# Patient Record
Sex: Female | Born: 1985 | Race: White | Hispanic: No | Marital: Single | Smoking: Current every day smoker
Health system: Southern US, Community
[De-identification: ages and names within clinical notes are randomized; demographics above are authoritative.]

## PROBLEM LIST (undated history)

## (undated) DIAGNOSIS — G8929 Other chronic pain: Secondary | ICD-10-CM

## (undated) DIAGNOSIS — R569 Unspecified convulsions: Secondary | ICD-10-CM

## (undated) DIAGNOSIS — M549 Dorsalgia, unspecified: Secondary | ICD-10-CM

## (undated) DIAGNOSIS — M419 Scoliosis, unspecified: Secondary | ICD-10-CM

## (undated) DIAGNOSIS — M797 Fibromyalgia: Secondary | ICD-10-CM

## (undated) HISTORY — PX: ABDOMINAL HYSTERECTOMY: SHX81

## (undated) HISTORY — PX: BLADDER SURGERY: SHX569

---
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
## 2012-02-05 ENCOUNTER — Emergency Department (HOSPITAL_COMMUNITY)
Admission: EM | Admit: 2012-02-05 | Discharge: 2012-02-05 | Disposition: A | Discharge status: Home / Self Care | Payer: Medicaid - Out of State | Attending: Emergency Medicine | Admitting: Emergency Medicine

## 2012-02-05 ENCOUNTER — Emergency Department (HOSPITAL_COMMUNITY): Payer: Medicaid - Out of State

## 2012-02-05 ENCOUNTER — Encounter (HOSPITAL_COMMUNITY): Payer: Self-pay | Admitting: Emergency Medicine

## 2012-02-05 DIAGNOSIS — B349 Viral infection, unspecified: Secondary | ICD-10-CM

## 2012-02-05 DIAGNOSIS — Z8701 Personal history of pneumonia (recurrent): Secondary | ICD-10-CM | POA: Insufficient documentation

## 2012-02-05 DIAGNOSIS — M4120 Other idiopathic scoliosis, site unspecified: Secondary | ICD-10-CM | POA: Insufficient documentation

## 2012-02-05 DIAGNOSIS — R110 Nausea: Secondary | ICD-10-CM | POA: Insufficient documentation

## 2012-02-05 DIAGNOSIS — J069 Acute upper respiratory infection, unspecified: Secondary | ICD-10-CM

## 2012-02-05 DIAGNOSIS — B9789 Other viral agents as the cause of diseases classified elsewhere: Secondary | ICD-10-CM | POA: Insufficient documentation

## 2012-02-05 DIAGNOSIS — R5381 Other malaise: Secondary | ICD-10-CM | POA: Insufficient documentation

## 2012-02-05 DIAGNOSIS — J3489 Other specified disorders of nose and nasal sinuses: Secondary | ICD-10-CM | POA: Insufficient documentation

## 2012-02-05 DIAGNOSIS — R6883 Chills (without fever): Secondary | ICD-10-CM | POA: Insufficient documentation

## 2012-02-05 DIAGNOSIS — F17200 Nicotine dependence, unspecified, uncomplicated: Secondary | ICD-10-CM | POA: Insufficient documentation

## 2012-02-05 DIAGNOSIS — Z79899 Other long term (current) drug therapy: Secondary | ICD-10-CM | POA: Insufficient documentation

## 2012-02-05 DIAGNOSIS — R5383 Other fatigue: Secondary | ICD-10-CM | POA: Insufficient documentation

## 2012-02-05 HISTORY — DX: Scoliosis, unspecified: M41.9

## 2012-02-05 HISTORY — DX: Other chronic pain: G89.29

## 2012-02-05 HISTORY — DX: Dorsalgia, unspecified: M54.9

## 2012-02-05 MED ORDER — BENZONATATE 100 MG PO CAPS: 100.0000 mg | ORAL_CAPSULE | Freq: Three times a day (TID) | ORAL | Status: DC | PRN

## 2012-02-05 NOTE — ED Notes (Signed)
Pt not in room, gown on bed.   

## 2012-02-05 NOTE — ED Provider Notes (Signed)
History     CSN: [id number]  Arrival date & time 02/05/12  1219   First MD Initiated Contact with Patient 02/05/12 1305      Chief Complaint  Patient presents with  . Cough  . Nasal Congestion  . Generalized Body Aches     HPI Pt was seen at 1325.   Per pt, c/o gradual onset and persistence of constant generalized body aches/fatigue, chills, runny/stuffy nose, ears and sinus congestion, nausea and cough for the past 1 week.  States she was eval at [facility] ER for same, dx with "double pneumonia" on a CXR, and rx antibiotics.  States she has taken them without relief.  Denies fevers, no rash, no CP/SOB, no N/V/D, no abd pain.     Past Medical History  Diagnosis Date  . Scoliosis   . Chronic back pain     Past Surgical History  Procedure Date  . Bladder surgery      History  Substance Use Topics  . Smoking status: Current Every Day Smoker -- 1.0 packs/day    Types: Cigarettes  . Smokeless tobacco: Not on file  . Alcohol Use: No      Review of Systems ROS: Statement: All systems negative except as marked or noted in the HPI; Constitutional: Negative for fever and +chills, generalized body aches/fatigue. ; ; Eyes: Negative for eye pain, redness and discharge. ; ; ENMT: Negative for ear pain, hoarseness, sore throat. +ears and nasal congestion, rhinorrhea, sinus pressure. ; ; Cardiovascular: Negative for chest pain, palpitations, diaphoresis, dyspnea and peripheral edema. ; ; Respiratory: +cough. Negative for wheezing and stridor. ; ; Gastrointestinal: +nausea. Negative for vomiting, diarrhea, abdominal pain, blood in stool, hematemesis, jaundice and rectal bleeding. . ; ; Genitourinary: Negative for dysuria, flank pain and hematuria. ; ; Musculoskeletal: Negative for back pain and neck pain. Negative for swelling and trauma.; ; Skin: Negative for pruritus, rash, abrasions, blisters, bruising and skin lesion.; ; Neuro: Negative for headache, lightheadedness and neck  stiffness. Negative for weakness, altered level of consciousness , altered mental status, extremity weakness, paresthesias, involuntary movement, seizure and syncope.      Allergies  Hydrocodone  Home Medications   Current Outpatient Rx  Name  Route  Sig  Dispense  Refill  . ALBUTEROL SULFATE HFA 108 (90 BASE) MCG/ACT IN AERS   Inhalation   Inhale 2 puffs into the lungs every 6 (six) hours as needed.         . IBUPROFEN 800 MG PO TABS   Oral   Take 800 mg by mouth every 8 (eight) hours as needed. For pain         . BENZONATATE 100 MG PO CAPS   Oral   Take 1 capsule (100 mg total) by mouth 3 (three) times daily as needed for cough.   15 capsule   0     BP 124/67  Pulse 91  Temp 97.5 F (36.4 C) (Oral)  Resp 18  Ht 5\' 9"  (1.753 m)  Wt 230 lb (104.327 kg)  BMI 33.96 kg/m2  SpO2 99%  LMP 01/22/2012  Physical Exam 1330: Physical examination:  Nursing notes reviewed; Vital signs and O2 SAT reviewed;  Constitutional: Well developed, Well nourished, Well hydrated, In no acute distress; Head:  Normocephalic, atraumatic; Eyes: EOMI, PERRL, No scleral icterus; ENMT: TM's clear bilat. +edemetous nasal turbinates bilat with clear rhinorrhea. Mouth and pharynx without lesions. No tonsillar exudates. No intra-oral edema. No hoarse voice, no drooling, no stridor. Mouth and  pharynx normal, Mucous membranes moist; Neck: Supple, Full range of motion, No lymphadenopathy; Cardiovascular: Regular rate and rhythm, No murmur, rub, or gallop; Respiratory: Breath sounds clear & equal bilaterally, No rales, rhonchi, wheezes.  Speaking full sentences with ease, Normal respiratory effort/excursion; Chest: Nontender, Movement normal; Abdomen: Soft, Nontender, Nondistended, Normal bowel sounds; Genitourinary: No CVA tenderness; Extremities: Pulses normal, No tenderness, No edema, No calf edema or asymmetry.; Neuro: AA&Ox3, Major CN grossly intact.  Speech clear. Gait steady. No gross focal motor or  sensory deficits in extremities.; Skin: Color normal, Warm, Dry.   ED Course  Procedures    MDM  MDM Reviewed: previous chart, nursing note and vitals Interpretation: x-ray   Dg Chest 2 View 02/05/2012  *RADIOLOGY REPORT*  Clinical Data: Cough  CHEST - 2 VIEW  Comparison: 02/05/2012  Findings: Normal heart size.  Clear lungs.  No pleural effusion. No pneumothorax.  Lateral left lower ribs not included limiting the exam.  IMPRESSION: No active cardiopulmonary disease.   Original Report Authenticated By: Jolaine Click, M.D.        1345:  No pneumonia on CXR.  Appears viral illness at this time, tx symptomatically.  Pt asking for "something stronger" for her generalized body aches "other than tylenol and motrin."  Informed that narcotic pain medications are not indicated for what appears to be a viral illness at this time.  Verb understanding.  States she already has an albuterol MDI at home but is not using it.  Encouraged to use 2-4 puffs every 4 hours for the next 7 days.  Will rx tessalon for cough.  Dx and testing d/w pt.  Questions answered.  Verb understanding, agreeable to d/c home with outpt f/u.         Laray Anger, DO 02/07/12 1337

## 2012-02-05 NOTE — ED Notes (Signed)
Pt c/o cough/congestion/body aches. Pt dx with pne x 1 week ago and on antibx with no relief.

## 2012-02-05 NOTE — ED Notes (Signed)
Pt reports was diagnosed with double pneumonia last week at [facility].  Says has been taking her antibiotics but isn't any better.  C/O congestion,  Nausea, "slimy stools"  And r earache.  Also reports chills.

## 2012-04-04 ENCOUNTER — Emergency Department (HOSPITAL_COMMUNITY): Payer: Medicaid - Out of State

## 2012-04-04 ENCOUNTER — Encounter (HOSPITAL_COMMUNITY): Payer: Self-pay

## 2012-04-04 ENCOUNTER — Emergency Department (HOSPITAL_COMMUNITY)
Admission: EM | Admit: 2012-04-04 | Discharge: 2012-04-04 | Disposition: A | Discharge status: Home / Self Care | Payer: Medicaid - Out of State | Attending: Emergency Medicine | Admitting: Emergency Medicine

## 2012-04-04 DIAGNOSIS — IMO0002 Reserved for concepts with insufficient information to code with codable children: Secondary | ICD-10-CM | POA: Insufficient documentation

## 2012-04-04 DIAGNOSIS — S0993XA Unspecified injury of face, initial encounter: Secondary | ICD-10-CM | POA: Insufficient documentation

## 2012-04-04 DIAGNOSIS — T07XXXA Unspecified multiple injuries, initial encounter: Secondary | ICD-10-CM

## 2012-04-04 DIAGNOSIS — S139XXA Sprain of joints and ligaments of unspecified parts of neck, initial encounter: Secondary | ICD-10-CM | POA: Insufficient documentation

## 2012-04-04 DIAGNOSIS — Z791 Long term (current) use of non-steroidal anti-inflammatories (NSAID): Secondary | ICD-10-CM | POA: Insufficient documentation

## 2012-04-04 DIAGNOSIS — Z8669 Personal history of other diseases of the nervous system and sense organs: Secondary | ICD-10-CM | POA: Insufficient documentation

## 2012-04-04 DIAGNOSIS — S161XXA Strain of muscle, fascia and tendon at neck level, initial encounter: Secondary | ICD-10-CM

## 2012-04-04 DIAGNOSIS — S298XXA Other specified injuries of thorax, initial encounter: Secondary | ICD-10-CM | POA: Insufficient documentation

## 2012-04-04 DIAGNOSIS — M549 Dorsalgia, unspecified: Secondary | ICD-10-CM

## 2012-04-04 DIAGNOSIS — Z79899 Other long term (current) drug therapy: Secondary | ICD-10-CM | POA: Insufficient documentation

## 2012-04-04 DIAGNOSIS — Z8739 Personal history of other diseases of the musculoskeletal system and connective tissue: Secondary | ICD-10-CM | POA: Insufficient documentation

## 2012-04-04 DIAGNOSIS — F17200 Nicotine dependence, unspecified, uncomplicated: Secondary | ICD-10-CM | POA: Insufficient documentation

## 2012-04-04 DIAGNOSIS — S199XXA Unspecified injury of neck, initial encounter: Secondary | ICD-10-CM | POA: Insufficient documentation

## 2012-04-04 DIAGNOSIS — S4980XA Other specified injuries of shoulder and upper arm, unspecified arm, initial encounter: Secondary | ICD-10-CM | POA: Insufficient documentation

## 2012-04-04 DIAGNOSIS — S46909A Unspecified injury of unspecified muscle, fascia and tendon at shoulder and upper arm level, unspecified arm, initial encounter: Secondary | ICD-10-CM | POA: Insufficient documentation

## 2012-04-04 HISTORY — DX: Unspecified convulsions: R56.9

## 2012-04-04 LAB — CBC WITH DIFFERENTIAL/PLATELET
HCT: 39.3 % (ref 36.0–46.0)
Hemoglobin: 13.3 g/dL (ref 12.0–15.0)
Lymphocytes Relative: 34 % (ref 12–46)
Monocytes Absolute: 0.4 10*3/uL (ref 0.1–1.0)
Monocytes Relative: 7 % (ref 3–12)
Neutro Abs: 3.6 10*3/uL (ref 1.7–7.7)
RBC: 4.29 MIL/uL (ref 3.87–5.11)
WBC: 6.4 10*3/uL (ref 4.0–10.5)

## 2012-04-04 LAB — BASIC METABOLIC PANEL
BUN: 7 mg/dL (ref 6–23)
CO2: 25 mEq/L (ref 19–32)
Chloride: 104 mEq/L (ref 96–112)
Creatinine, Ser: 0.67 mg/dL (ref 0.50–1.10)
Potassium: 3.9 mEq/L (ref 3.5–5.1)

## 2012-04-04 MED ORDER — HYDROMORPHONE HCL PF 1 MG/ML IJ SOLN
1.0000 mg | Freq: Once | INTRAMUSCULAR | Status: AC
  Administered 2012-04-04: 1 mg via INTRAVENOUS
  Filled 2012-04-04: qty 1

## 2012-04-04 MED ORDER — IOHEXOL 300 MG/ML  SOLN
100.0000 mL | Freq: Once | INTRAMUSCULAR | Status: AC | PRN
  Administered 2012-04-04: 100 mL via INTRAVENOUS

## 2012-04-04 MED ORDER — TRAMADOL HCL 50 MG PO TABS: 50.0000 mg | ORAL_TABLET | Freq: Four times a day (QID) | ORAL | Status: DC | PRN

## 2012-04-04 MED ORDER — ONDANSETRON HCL 4 MG/2ML IJ SOLN
4.0000 mg | Freq: Once | INTRAMUSCULAR | Status: AC
  Administered 2012-04-04: 4 mg via INTRAVENOUS
  Filled 2012-04-04: qty 2

## 2012-04-04 MED ORDER — NAPROXEN 500 MG PO TABS: 500.0000 mg | ORAL_TABLET | Freq: Two times a day (BID) | ORAL | Status: DC

## 2012-04-04 NOTE — ED Notes (Signed)
Pt requested a prescription for xanax.  Says she uses xanax to treat her seizures.  Notified Dr. Deretha Emory, no prescription for xanax given.  PT also said that tramadol causes her to have seizures so could not take.  Notified EDP.
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
## 2012-10-03 DIAGNOSIS — R569 Unspecified convulsions: Secondary | ICD-10-CM

## 2012-10-28 ENCOUNTER — Encounter (HOSPITAL_COMMUNITY): Payer: Self-pay | Admitting: Emergency Medicine

## 2012-10-28 ENCOUNTER — Emergency Department (HOSPITAL_COMMUNITY)
Admission: EM | Admit: 2012-10-28 | Discharge: 2012-10-29 | Discharge status: Against Medical Advice | Payer: Medicaid - Out of State | Attending: Emergency Medicine | Admitting: Emergency Medicine

## 2012-10-28 DIAGNOSIS — R51 Headache: Secondary | ICD-10-CM | POA: Insufficient documentation

## 2012-10-28 NOTE — ED Notes (Signed)
Pt c/o body aches and headace x 2 days.

## 2012-10-29 NOTE — ED Notes (Signed)
Unable to locate pt in all waiting areas x 3 

## 2012-12-14 ENCOUNTER — Emergency Department (HOSPITAL_COMMUNITY): Payer: Medicaid - Out of State

## 2012-12-14 ENCOUNTER — Emergency Department (HOSPITAL_COMMUNITY)
Admission: EM | Admit: 2012-12-14 | Discharge: 2012-12-14 | Disposition: A | Discharge status: Home / Self Care | Payer: Medicaid - Out of State | Attending: Emergency Medicine | Admitting: Emergency Medicine

## 2012-12-14 ENCOUNTER — Encounter (HOSPITAL_COMMUNITY): Payer: Self-pay | Admitting: Emergency Medicine

## 2012-12-14 DIAGNOSIS — Z3202 Encounter for pregnancy test, result negative: Secondary | ICD-10-CM | POA: Insufficient documentation

## 2012-12-14 DIAGNOSIS — Z79899 Other long term (current) drug therapy: Secondary | ICD-10-CM | POA: Insufficient documentation

## 2012-12-14 DIAGNOSIS — R109 Unspecified abdominal pain: Secondary | ICD-10-CM | POA: Insufficient documentation

## 2012-12-14 DIAGNOSIS — G40909 Epilepsy, unspecified, not intractable, without status epilepticus: Secondary | ICD-10-CM | POA: Insufficient documentation

## 2012-12-14 DIAGNOSIS — M545 Low back pain, unspecified: Secondary | ICD-10-CM | POA: Insufficient documentation

## 2012-12-14 DIAGNOSIS — Z91040 Latex allergy status: Secondary | ICD-10-CM | POA: Insufficient documentation

## 2012-12-14 DIAGNOSIS — G8911 Acute pain due to trauma: Secondary | ICD-10-CM | POA: Insufficient documentation

## 2012-12-14 DIAGNOSIS — F17200 Nicotine dependence, unspecified, uncomplicated: Secondary | ICD-10-CM | POA: Insufficient documentation

## 2012-12-14 DIAGNOSIS — G8929 Other chronic pain: Secondary | ICD-10-CM | POA: Insufficient documentation

## 2012-12-14 DIAGNOSIS — M25539 Pain in unspecified wrist: Secondary | ICD-10-CM | POA: Insufficient documentation

## 2012-12-14 LAB — URINALYSIS, ROUTINE W REFLEX MICROSCOPIC
Bilirubin Urine: NEGATIVE
Glucose, UA: NEGATIVE mg/dL
Ketones, ur: NEGATIVE mg/dL
Leukocytes, UA: NEGATIVE
Specific Gravity, Urine: 1.02 (ref 1.005–1.030)
pH: 7 (ref 5.0–8.0)

## 2012-12-14 MED ORDER — OXYCODONE-ACETAMINOPHEN 5-325 MG PO TABS
1.0000 | ORAL_TABLET | Freq: Once | ORAL | Status: AC
  Administered 2012-12-14: 1 via ORAL
  Filled 2012-12-14: qty 1

## 2012-12-14 MED ORDER — IBUPROFEN 600 MG PO TABS: 600.0000 mg | ORAL_TABLET | Freq: Four times a day (QID) | ORAL | Status: DC | PRN

## 2012-12-14 MED ORDER — OXYCODONE-ACETAMINOPHEN 5-325 MG PO TABS: 1.0000 | ORAL_TABLET | ORAL | Status: DC | PRN
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
## 2012-12-17 NOTE — ED Provider Notes (Signed)
Medical screening examination/treatment/procedure(s) were performed by non-physician practitioner and as supervising physician I was immediately available for consultation/collaboration.  EKG Interpretation   None         Tereza Gilham M Javanni Maring, DO 12/17/12 1300 

## 2014-03-03 ENCOUNTER — Emergency Department (HOSPITAL_COMMUNITY): Payer: Medicaid - Out of State

## 2014-03-03 ENCOUNTER — Encounter (HOSPITAL_COMMUNITY): Payer: Self-pay | Admitting: *Deleted

## 2014-03-03 ENCOUNTER — Emergency Department (HOSPITAL_COMMUNITY)
Admission: EM | Admit: 2014-03-03 | Discharge: 2014-03-03 | Disposition: A | Discharge status: Home / Self Care | Payer: Medicaid - Out of State | Attending: Emergency Medicine | Admitting: Emergency Medicine

## 2014-03-03 DIAGNOSIS — S3992XA Unspecified injury of lower back, initial encounter: Secondary | ICD-10-CM | POA: Insufficient documentation

## 2014-03-03 DIAGNOSIS — M25551 Pain in right hip: Secondary | ICD-10-CM

## 2014-03-03 DIAGNOSIS — G8929 Other chronic pain: Secondary | ICD-10-CM | POA: Diagnosis not present

## 2014-03-03 DIAGNOSIS — S79911A Unspecified injury of right hip, initial encounter: Secondary | ICD-10-CM | POA: Diagnosis not present

## 2014-03-03 DIAGNOSIS — R569 Unspecified convulsions: Secondary | ICD-10-CM | POA: Diagnosis not present

## 2014-03-03 DIAGNOSIS — Y9389 Activity, other specified: Secondary | ICD-10-CM | POA: Insufficient documentation

## 2014-03-03 DIAGNOSIS — Z720 Tobacco use: Secondary | ICD-10-CM | POA: Insufficient documentation

## 2014-03-03 DIAGNOSIS — E669 Obesity, unspecified: Secondary | ICD-10-CM | POA: Insufficient documentation

## 2014-03-03 DIAGNOSIS — Z79899 Other long term (current) drug therapy: Secondary | ICD-10-CM | POA: Diagnosis not present

## 2014-03-03 DIAGNOSIS — S299XXA Unspecified injury of thorax, initial encounter: Secondary | ICD-10-CM | POA: Diagnosis not present

## 2014-03-03 DIAGNOSIS — W182XXA Fall in (into) shower or empty bathtub, initial encounter: Secondary | ICD-10-CM | POA: Insufficient documentation

## 2014-03-03 DIAGNOSIS — W19XXXA Unspecified fall, initial encounter: Secondary | ICD-10-CM

## 2014-03-03 DIAGNOSIS — Y9289 Other specified places as the place of occurrence of the external cause: Secondary | ICD-10-CM | POA: Diagnosis not present

## 2014-03-03 DIAGNOSIS — R0789 Other chest pain: Secondary | ICD-10-CM

## 2014-03-03 DIAGNOSIS — Z8739 Personal history of other diseases of the musculoskeletal system and connective tissue: Secondary | ICD-10-CM | POA: Insufficient documentation

## 2014-03-03 DIAGNOSIS — Y998 Other external cause status: Secondary | ICD-10-CM | POA: Insufficient documentation

## 2014-03-03 DIAGNOSIS — Z91040 Latex allergy status: Secondary | ICD-10-CM | POA: Insufficient documentation

## 2014-03-03 DIAGNOSIS — M545 Low back pain: Secondary | ICD-10-CM

## 2014-03-03 HISTORY — DX: Fibromyalgia: M79.7

## 2014-03-03 LAB — BASIC METABOLIC PANEL
ANION GAP: 5 (ref 5–15)
BUN: 8 mg/dL (ref 6–23)
CHLORIDE: 110 meq/L (ref 96–112)
CO2: 25 mmol/L (ref 19–32)
Calcium: 9.1 mg/dL (ref 8.4–10.5)
Creatinine, Ser: 0.57 mg/dL (ref 0.50–1.10)
GFR calc non Af Amer: >90 mL/min (ref >90)
Glucose, Bld: 79 mg/dL (ref 70–99)
POTASSIUM: 4.1 mmol/L (ref 3.5–5.1)
Sodium: 140 mmol/L (ref 135–145)

## 2014-03-03 LAB — CBC WITH DIFFERENTIAL/PLATELET
BASOS ABS: 0 10*3/uL (ref 0.0–0.1)
BASOS PCT: 0 % (ref 0–1)
EOS ABS: 0.1 10*3/uL (ref 0.0–0.7)
Eosinophils Relative: 2 % (ref 0–5)
HCT: 37.2 % (ref 36.0–46.0)
Hemoglobin: 12.5 g/dL (ref 12.0–15.0)
Lymphocytes Relative: 40 % (ref 12–46)
Lymphs Abs: 2.4 10*3/uL (ref 0.7–4.0)
MCH: 30.6 pg (ref 26.0–34.0)
MCHC: 33.6 g/dL (ref 30.0–36.0)
MCV: 91.2 fL (ref 78.0–100.0)
MONOS PCT: 9 % (ref 3–12)
Monocytes Absolute: 0.5 10*3/uL (ref 0.1–1.0)
NEUTROS ABS: 3 10*3/uL (ref 1.7–7.7)
Neutrophils Relative %: 49 % (ref 43–77)
PLATELETS: 238 10*3/uL (ref 150–400)
RBC: 4.08 MIL/uL (ref 3.87–5.11)
RDW: 12.2 % (ref 11.5–15.5)
WBC: 6 10*3/uL (ref 4.0–10.5)

## 2014-03-03 MED ORDER — HYDROCODONE-ACETAMINOPHEN 5-325 MG PO TABS: 2.0000 | ORAL_TABLET | ORAL | Status: DC | PRN

## 2014-03-03 MED ORDER — IBUPROFEN 800 MG PO TABS
800.0000 mg | ORAL_TABLET | Freq: Once | ORAL | Status: AC
  Administered 2014-03-03: 800 mg via ORAL
  Filled 2014-03-03: qty 1

## 2014-03-03 MED ORDER — HYDROCODONE-ACETAMINOPHEN 5-325 MG PO TABS
2.0000 | ORAL_TABLET | Freq: Once | ORAL | Status: AC
  Administered 2014-03-03: 2 via ORAL
  Filled 2014-03-03: qty 2

## 2014-03-03 NOTE — ED Notes (Signed)
Patient with no complaints at this time. Respirations even and unlabored. Skin warm/dry. Discharge instructions reviewed with patient at this time. Patient given opportunity to voice concerns/ask questions. Patient discharged at this time and left Emergency Department with steady gait.   

## 2014-03-03 NOTE — Discharge Instructions (Signed)
X-rays show no fracture. Prescription for pain medicine. Resource guide given.     Emergency Department Resource Guide 1) Find a Doctor and Pay Out of Pocket Although you won't have to find out who is covered by your insurance plan, it is a good idea to ask around and get recommendations. You will then need to call the office and see if the doctor you have chosen will accept you as a new patient and what types of options they offer for patients who are self-pay. Some doctors offer discounts or will set up payment plans for their patients who do not have insurance, but you will need to ask so you aren't surprised when you get to your appointment.  2) Contact Your Local Health Department Not all health departments have doctors that can see patients for sick visits, but many do, so it is worth a call to see if yours does. If you don't know where your local health department is, you can check in your phone book. The CDC also has a tool to help you locate your state's health department, and many state websites also have listings of all of their local health departments.  3) Find a Walk-in Clinic If your illness is not likely to be very severe or complicated, you may want to try a walk in clinic. These are popping up all over the country in pharmacies, drugstores, and shopping centers. They're usually staffed by nurse practitioners or physician assistants that have been trained to treat common illnesses and complaints. They're usually fairly quick and inexpensive. However, if you have serious medical issues or chronic medical problems, these are probably not your best option.  No Primary Care Doctor: - Call Health Connect at  [phone] - they can help you locate a primary care doctor that  accepts your insurance, provides certain services, etc. - Physician Referral Service- [phone]  Chronic Pain Problems: Organization         Address  Phone   Notes  [facility]  [phone])  [phone] Patients need to be referred by their primary care doctor.   Medication Assistance: Organization         Address  Phone   Notes  [facility] Medication [facility] [address] [place], [place] [address] [phone] --Must be a resident of [place] [facility] [place] -- Must have NO insurance coverage whatsoever (no Medicaid/ Medicare, etc.) -- The pt. MUST have a primary care doctor that directs their care regularly and follows them in the community   MedAssist  [phone]   [employer]  [phone]) [phone]    Agencies that provide inexpensive medical care: Organization         Address  Phone   Notes  [facility]  [phone]) [phone]   [facility]    [phone]) [phone]   [facility] [address] [place], [place] [address] [phone]   [facility] [address], [place] [phone]   Planned Parenthood    [phone](336) [phone]   [facility]    [phone]   [facility]  [address], [place] Phone:  [phone], Fax:  [phone] Hours of Operation:  9 am - 6 pm, M-F.  Also accepts Medicaid/Medicare and self-pay.  [facility]  [address], [place] Phone: [phone](336) [phone], Fax: [phone]) [phone]. Hours of Operation:  8:30 am -  5:30 pm, M-F.  Also accepts Medicaid and self-pay.  [facility] [place] [address], [place] Phone: [phone](336) [phone]   [facility] [address], [place], [place] [phone], Ext. [phone] Mondays & Thursdays: 7-9 AM.  First 15 patients are seen on a first come, first serve basis.    Medicaid-accepting [facility] Providers:  Organization         Address  Phone   Notes  [facility] [address], [place] [phone]) [phone] Also accepts self-pay patients.  [facility] [address], [place]   [phone]) [phone]   [facility] [address], [place] [phone](336) [phone]   [facility] [address] [place] [address], [place] [phone]   Renaye RakersVeita Bland [address], [place]   [phone] Only accepts [place] Access [place] patients after they have their name applied to their card.   Self-Pay (no insurance) in [facility] [place]:  Organization         Address  Phone   Notes  Sickle Cell Patients, [facility] [address], [place] [phone]   [facility] [address], [place] [phone]   [facility] [place]  [address], [place] [phone]   [facility]/Dr. Osei-Bonsu  [address] [place] [address], [place] or [phone], [place] [phone] Phone number for both [place] and [place] locations is the same.  [facility] [address], [place] [phone]   [facility] [place] [address] [place] [address], [place] or [address] [phone]) [phone]   [facility] [address], [place] [phone]) [phone], phone; [phone], fax Sees patients 1st and 3rd Saturday of every month.  Must not qualify for public or private insurance (i.e. Medicaid, Medicare, [place] Health Choice, Veterans' Benefits)  Household income should be no more than 200% of the poverty level The clinic cannot treat you if you are pregnant or think you are pregnant  Sexually transmitted diseases are not treated at the clinic.    Dental Care: Organization         Address  Phone  Notes  [facility] Department of [facility] [address], [place] [phone] Accepts children up to age 31 who are enrolled in [place]icaid or [place] Health Choice; pregnant women with a Medicaid card; and children who have applied for Medicaid or [place] Health Choice, but  were declined, whose parents can pay a reduced fee at time of service.  [facility] of [facility] [place]  [address], [place] [phone] Accepts children up to age 31 who are enrolled in [place] or [place] Health Choice; pregnant women with a Medicaid card; and children who have applied for Medicaid or [place] Health Choice, but were declined, whose parents can pay a reduced fee at time of service.  [facility]  [address], [place] [phone] Patients are seen by appointment only. Walk-ins are not accepted. [facility] will see patients 31 years of age and older. Monday - Tuesday (8am-5pm) Most Wednesdays (8:30-5pm) $30 per visit, cash only  [facility]  [address], [facility] [phone] Patients are seen by appointment only. Walk-ins are  not accepted. [place] will see patients 44 years of age and older. One Wednesday Evening (Monthly: Volunteer Based).  $30 per visit, cash only  [place]  [phone] for adults; Children under age 9, call Graduate Pediatric Dentistry at [phone]. Children aged 15-14, please call [phone] to request a pediatric application.  Dental services are provided in all areas of dental care including fillings, crowns and bridges, complete and partial dentures, implants, gum treatment, root canals, and extractions. Preventive care is also provided. Treatment is provided to both adults and children. Patients are selected via a lottery and there is often a waiting list.   [facility] [address], [place]  [phone] [email or web address]   [facility] [address] [place], [place] [phone], Ext. [phone] Second and Fourth Thursday of each month, opens at 6:30 AM; Clinic ends at 9 AM.  Patients are seen on a first-come first-served basis, and a limited number are seen during each clinic.    [facility]  [address] Hillard Danker [place], [place] [phone]   Eligibility Requirements You must have lived in [place], [place], or [place] counties for at least the last three months.   You cannot be eligible for state or federal sponsored [employer], including [employer], [place], or [employer].   You generally cannot be eligible for healthcare insurance through your employer.    How to apply: Eligibility screenings are held every Tuesday and Wednesday afternoon from 1:00 pm until 4:00 pm. You do not need an appointment for the interview!  [facility] [address] [place], [place]   [place]  [place] Department  [place]  [phone]    Behavioral Health Resources in the Community: Intensive Outpatient Programs Organization         Address  Phone  Notes  [place]. [address], [place], [place] [phone]   [facility] [address] [place], [place]   ADS: Alcohol & Drug Svcs [address] [place], [place]   [place] [address],  [place], [place] or [phone]   Substance Abuse Resources Organization         Address  Phone  Notes  Alcohol and Drug Services  [phone]   [place]  [phone]   The [place]   Chinita Pester  [phone]   Residential & Outpatient Substance Abuse Program  [phone]   Psychological Services Organization         Address  Phone  Notes  [facility] [place]  [place]  [phone]   [place] [address], [place] or [phone]    Mobile Crisis Teams Organization         Address  Phone  Notes  [facility], Mobile Crisis Care  Unit  [phone]   [facility] Psychotherapeutic Services  [address] [place], [place]   Bas[place] Levels [address], [place] [phone]    Self-Help/Support Groups Organization         Address  Phone             Notes  [place]. of [place] - variety of support groups  [place] Call for more information  Narcotics Anonymous (NA),  [facility] [address], [employer] [place]  2 meetings at this location   Residential Facilities manager         Address  Phone  Notes  [facility]idential Treatment [place],    [place]  [phone]   [facility]  [address] [place] 681275, [place], [place]   [place], [place] [phone] Admissions: 8am-3pm M-F  Incentives Substance [place] [address]    [place], [place] [phone]   [facility] [address] [place], [place], [place]   [facility] [address],  [place], [place]   Insight Programs - Intensive Outpatient [address], [place], [place]   [facility]Outlook[facility] [address]  [place], [place] [phone] or [phone]   [facility]idential Treatment Services (RTS) [address], [place], [place] Accepts Medicaid  [facility] [place] [address],  [place] [phone] Substance Abuse/Addiction Treatment   [facility] Organization         Address  Phone  Notes  [facility]  [phone]   Domenic Schwab, PhD [address] Arlis Porta [place], [place]   [phone] or [phone]   [place], [place] [phone]   [facility] [address] [place], [place] [phone] Insurance/Medicaid/sponsorship through [facility] and Families [address]                                     [place], [place] [phone] [place] [address] [place], [place] [phone]    Dr. Adele Schilder  [phone]   [facility] Dept. 1) [address], [place] 2) [place] 3)  [place] [address], [place] [phone]  [phone]   [place] [phone] or [phone] (After Hours)
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
## 2014-03-03 NOTE — ED Notes (Signed)
Patient requesting pain medication. MD aware. Verbal order obtained for 800 mg Ibuprofen
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
## 2016-01-16 ENCOUNTER — Emergency Department (HOSPITAL_COMMUNITY)
Admission: EM | Admit: 2016-01-16 | Discharge: 2016-01-17 | Disposition: A | Discharge status: Home / Self Care | Payer: Self-pay | Attending: Emergency Medicine | Admitting: Emergency Medicine

## 2016-01-16 ENCOUNTER — Encounter (HOSPITAL_COMMUNITY): Payer: Self-pay | Admitting: Emergency Medicine

## 2016-01-16 DIAGNOSIS — S60011A Contusion of right thumb without damage to nail, initial encounter: Secondary | ICD-10-CM | POA: Insufficient documentation

## 2016-01-16 DIAGNOSIS — Y999 Unspecified external cause status: Secondary | ICD-10-CM | POA: Insufficient documentation

## 2016-01-16 DIAGNOSIS — F17210 Nicotine dependence, cigarettes, uncomplicated: Secondary | ICD-10-CM | POA: Insufficient documentation

## 2016-01-16 DIAGNOSIS — Y939 Activity, unspecified: Secondary | ICD-10-CM | POA: Insufficient documentation

## 2016-01-16 DIAGNOSIS — R4182 Altered mental status, unspecified: Secondary | ICD-10-CM

## 2016-01-16 DIAGNOSIS — T07XXXA Unspecified multiple injuries, initial encounter: Secondary | ICD-10-CM

## 2016-01-16 DIAGNOSIS — Y929 Unspecified place or not applicable: Secondary | ICD-10-CM | POA: Insufficient documentation

## 2016-01-16 DIAGNOSIS — Z79899 Other long term (current) drug therapy: Secondary | ICD-10-CM | POA: Insufficient documentation

## 2016-01-16 DIAGNOSIS — T426X1A Poisoning by other antiepileptic and sedative-hypnotic drugs, accidental (unintentional), initial encounter: Secondary | ICD-10-CM | POA: Insufficient documentation

## 2016-01-16 LAB — COMPREHENSIVE METABOLIC PANEL
ALK PHOS: 98 U/L (ref 38–126)
ALT: 11 U/L — AB (ref 14–54)
AST: 18 U/L (ref 15–41)
Albumin: 4.3 g/dL (ref 3.5–5.0)
Anion gap: 7 (ref 5–15)
BILIRUBIN TOTAL: 1.1 mg/dL (ref 0.3–1.2)
BUN: 11 mg/dL (ref 6–20)
CALCIUM: 9.2 mg/dL (ref 8.9–10.3)
CO2: 25 mmol/L (ref 22–32)
CREATININE: 0.78 mg/dL (ref 0.44–1.00)
Chloride: 104 mmol/L (ref 101–111)
GFR calc Af Amer: >60 mL/min (ref >60)
GFR calc non Af Amer: >60 mL/min (ref >60)
GLUCOSE: 86 mg/dL (ref 65–99)
Potassium: 4.3 mmol/L (ref 3.5–5.1)
Sodium: 136 mmol/L (ref 135–145)
TOTAL PROTEIN: 8.2 g/dL — AB (ref 6.5–8.1)

## 2016-01-16 LAB — RAPID URINE DRUG SCREEN, HOSP PERFORMED
AMPHETAMINES: POSITIVE — AB
BARBITURATES: NOT DETECTED
Benzodiazepines: POSITIVE — AB
COCAINE: POSITIVE — AB
Opiates: NOT DETECTED
TETRAHYDROCANNABINOL: NOT DETECTED

## 2016-01-16 LAB — ETHANOL: Alcohol, Ethyl (B): 8 mg/dL — ABNORMAL HIGH (ref <5)

## 2016-01-16 LAB — SALICYLATE LEVEL: Salicylate Lvl: <7 mg/dL (ref 2.8–30.0)

## 2016-01-16 LAB — ACETAMINOPHEN LEVEL: Acetaminophen (Tylenol), Serum: <10 ug/mL — ABNORMAL LOW (ref 10–30)

## 2016-01-16 LAB — PREGNANCY, URINE: Preg Test, Ur: NEGATIVE
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
## 2016-01-17 ENCOUNTER — Emergency Department (HOSPITAL_COMMUNITY): Payer: Self-pay

## 2016-01-17 MED ORDER — CYCLOBENZAPRINE HCL 5 MG PO TABS: 5.0000 mg | ORAL_TABLET | Freq: Three times a day (TID) | ORAL | 0 refills | Status: DC | PRN

## 2016-01-17 MED ORDER — NAPROXEN 500 MG PO TABS: 500.0000 mg | ORAL_TABLET | Freq: Two times a day (BID) | ORAL | 0 refills | Status: DC

## 2016-01-17 NOTE — Discharge Instructions (Signed)
Use ice packs to the bruised or swollen areas. Wear the ankle support and thumb support for the next 1-2 weeks. If you continue to have pain you should be rechecked by an orthopedist. Call Dr Mort SawyersHarrison's office to get an appointment.
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
## 2016-07-24 ENCOUNTER — Emergency Department (HOSPITAL_COMMUNITY): Payer: Self-pay

## 2016-07-24 ENCOUNTER — Emergency Department (HOSPITAL_COMMUNITY)
Admission: EM | Admit: 2016-07-24 | Discharge: 2016-07-24 | Disposition: A | Discharge status: Home / Self Care | Payer: Self-pay | Attending: Emergency Medicine | Admitting: Emergency Medicine

## 2016-07-24 ENCOUNTER — Encounter (HOSPITAL_COMMUNITY): Payer: Self-pay | Admitting: Emergency Medicine

## 2016-07-24 DIAGNOSIS — Z91040 Latex allergy status: Secondary | ICD-10-CM | POA: Insufficient documentation

## 2016-07-24 DIAGNOSIS — R569 Unspecified convulsions: Secondary | ICD-10-CM | POA: Insufficient documentation

## 2016-07-24 DIAGNOSIS — S0003XA Contusion of scalp, initial encounter: Secondary | ICD-10-CM | POA: Insufficient documentation

## 2016-07-24 DIAGNOSIS — Y92410 Unspecified street and highway as the place of occurrence of the external cause: Secondary | ICD-10-CM | POA: Insufficient documentation

## 2016-07-24 DIAGNOSIS — Y939 Activity, unspecified: Secondary | ICD-10-CM | POA: Insufficient documentation

## 2016-07-24 DIAGNOSIS — Z79899 Other long term (current) drug therapy: Secondary | ICD-10-CM | POA: Insufficient documentation

## 2016-07-24 DIAGNOSIS — F17210 Nicotine dependence, cigarettes, uncomplicated: Secondary | ICD-10-CM | POA: Insufficient documentation

## 2016-07-24 DIAGNOSIS — Y999 Unspecified external cause status: Secondary | ICD-10-CM | POA: Insufficient documentation

## 2016-07-24 DIAGNOSIS — W19XXXA Unspecified fall, initial encounter: Secondary | ICD-10-CM | POA: Insufficient documentation

## 2016-07-24 LAB — BASIC METABOLIC PANEL
Anion gap: 9 (ref 5–15)
BUN: 9 mg/dL (ref 6–20)
CO2: 25 mmol/L (ref 22–32)
Calcium: 9.5 mg/dL (ref 8.9–10.3)
Chloride: 104 mmol/L (ref 101–111)
Creatinine, Ser: 0.78 mg/dL (ref 0.44–1.00)
GFR calc Af Amer: >60 mL/min (ref >60)
GFR calc non Af Amer: >60 mL/min (ref >60)
Glucose, Bld: 103 mg/dL — ABNORMAL HIGH (ref 65–99)
Potassium: 4 mmol/L (ref 3.5–5.1)
Sodium: 138 mmol/L (ref 135–145)

## 2016-07-24 LAB — CBC WITH DIFFERENTIAL/PLATELET
Basophils Absolute: 0 10*3/uL (ref 0.0–0.1)
Basophils Relative: 0 %
Eosinophils Absolute: 0.2 10*3/uL (ref 0.0–0.7)
Eosinophils Relative: 1 %
HCT: 46.1 % — ABNORMAL HIGH (ref 36.0–46.0)
Hemoglobin: 15.6 g/dL — ABNORMAL HIGH (ref 12.0–15.0)
Lymphocytes Relative: 14 %
Lymphs Abs: 2.5 10*3/uL (ref 0.7–4.0)
MCH: 30.8 pg (ref 26.0–34.0)
MCHC: 33.8 g/dL (ref 30.0–36.0)
MCV: 91.1 fL (ref 78.0–100.0)
Monocytes Absolute: 1.1 10*3/uL — ABNORMAL HIGH (ref 0.1–1.0)
Monocytes Relative: 6 %
Neutro Abs: 14.1 10*3/uL — ABNORMAL HIGH (ref 1.7–7.7)
Neutrophils Relative %: 79 %
Platelets: 233 10*3/uL (ref 150–400)
RBC: 5.06 MIL/uL (ref 3.87–5.11)
RDW: 12.5 % (ref 11.5–15.5)
WBC: 17.9 10*3/uL — ABNORMAL HIGH (ref 4.0–10.5)

## 2016-07-24 LAB — CBG MONITORING, ED: Glucose-Capillary: 119 mg/dL — ABNORMAL HIGH (ref 65–99)

## 2016-07-24 MED ORDER — IBUPROFEN 400 MG PO TABS
600.0000 mg | ORAL_TABLET | Freq: Once | ORAL | Status: AC
  Administered 2016-07-24: 600 mg via ORAL
  Filled 2016-07-24: qty 2

## 2016-07-24 MED ORDER — LORAZEPAM 1 MG PO TABS
1.0000 mg | ORAL_TABLET | Freq: Once | ORAL | Status: AC
  Administered 2016-07-24: 1 mg via ORAL
  Filled 2016-07-24: qty 1
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
## 2016-07-24 NOTE — ED Provider Notes (Signed)
[facility] DEPT Provider Note   CSN: [id number] Arrival date & time: 07/24/16  1310     History   Chief Complaint Chief Complaint  Patient presents with  . Seizures    HPI Lindsay Perkins is a 31 y.o. female.  HPI   31 year old female with seizure-like activity. Happened shortly before arrival. Happened in police custody. [place] Seat and struck head. Was unresponsive to voice and had some generalized shaking. Lasted about a minute. No incontinence. She repotedly was confused after for several minutes then progressively more alert. By arrival to the ED she was AOx3. C/o headache and L elbow pain. Reports hx of seizures. Estimates last one about a month ago. Not compliant with topamax because of finances. Felt fine just prior to this incident. Denies recent drug use or other acute ingestion.   Past Medical History:  Diagnosis Date  . Chronic back pain   . Fibromyalgia   . Scoliosis   . Seizures (HCC)     There are no active problems to display for this patient.   Past Surgical History:  Procedure Laterality Date  . ABDOMINAL HYSTERECTOMY    . BLADDER SURGERY      OB History    No data available       Home Medications    Prior to Admission medications   Medication Sig Start Date End Date Taking? Authorizing Provider  acetaminophen (TYLENOL) 500 MG tablet Take 500 mg by mouth every 6 (six) hours as needed for pain.    [provider]  albuterol (PROVENTIL HFA;VENTOLIN HFA) 108 (90 BASE) MCG/ACT inhaler Inhale 2 puffs into the lungs every 6 (six) hours as needed (chest congestion).     [provider]  ALPRAZolam Prudy Feeler) 1 MG tablet Take 1 mg by mouth 2 (two) times daily as needed. 02/11/14   [provider]  cyclobenzaprine (FLEXERIL) 5 MG tablet Take 1 tablet (5 mg total) by mouth 3 (three) times daily as needed. 01/17/16   Devoria Albe, MD  ibuprofen (ADVIL,MOTRIN) 200 MG tablet Take 200 mg by mouth every 6 (six) hours as needed for pain.     [provider]  naproxen (NAPROSYN) 500 MG tablet Take 1 tablet (500 mg total) by mouth 2 (two) times daily. 01/17/16   Devoria Albe, MD    Family History History reviewed. No pertinent family history.  Social History Social History  Substance Use Topics  . Smoking status: Current Every Day Smoker    Packs/day: 1.00    Types: Cigarettes  . Smokeless tobacco: Never Used  . Alcohol use No     Allergies   Ultram [tramadol] and Latex   Review of Systems Review of Systems  All systems reviewed and negative, other than as noted in HPI.  Physical Exam Updated Vital Signs BP (!) 118/33 (BP Location: Right Arm) Comment: Simultaneous filing. User may not have seen previous data. Comment (BP Location): Simultaneous filing. User may not have seen previous data.  Pulse 99   Temp 98.1 F (36.7 C) (Oral) Comment: Simultaneous filing. User may not have seen previous data. Comment (Src): Simultaneous filing. User may not have seen previous data.  Resp 13   Ht 5\' 9"  (1.753 m)   Wt 104.3 kg (230 lb)   LMP 11/30/2012   SpO2 96%   BMI 33.97 kg/m   Physical Exam  Constitutional: She is oriented to person, place, and time. She appears well-developed and well-nourished. No distress.  HENT:  Head: Normocephalic.  R parietal scalp hematoma.  No midline cervical tenderness.   Eyes: Conjunctivae are normal. Right eye exhibits no discharge. Left eye exhibits no discharge.  Neck: Neck supple.  Cardiovascular: Normal rate, regular rhythm and normal heart sounds.  Exam reveals no gallop and no friction rub.   No murmur heard. Pulmonary/Chest: Effort normal and breath sounds normal. No respiratory distress.  Abdominal: Soft. She exhibits no distension. There is no tenderness.  Musculoskeletal: She exhibits no edema or tenderness.  Mild TTP L elbow. No swelling/deformity. Can actively range fully. NVI distally. NO midline spinal tenderness.   Neurological: She is alert and oriented to  person, place, and time. No cranial nerve deficit or sensory deficit. She exhibits normal muscle tone. Coordination normal.  Skin: Skin is warm and dry.  Psychiatric: She has a normal mood and affect. Her behavior is normal. Thought content normal.  Nursing note and vitals reviewed.    ED Treatments / Results  Labs (all labs ordered are listed, but only abnormal results are displayed) Labs Reviewed  CBC WITH DIFFERENTIAL/PLATELET - Abnormal; Notable for the following:       Result Value   WBC 17.9 (*)    Hemoglobin 15.6 (*)    HCT 46.1 (*)    Neutro Abs 14.1 (*)    Monocytes Absolute 1.1 (*)    All other components within normal limits  BASIC METABOLIC PANEL - Abnormal; Notable for the following:    Glucose, Bld 103 (*)    All other components within normal limits  CBG MONITORING, ED - Abnormal; Notable for the following:    Glucose-Capillary 119 (*)    All other components within normal limits    EKG  EKG Interpretation None       Radiology No results found.  Procedures Procedures (including critical care time)  Medications Ordered in ED Medications - No data to display   Initial Impression / Assessment and Plan / ED Course  I have reviewed the triage vital signs and the nursing notes.  Pertinent labs & imaging results that were available during my care of the patient were reviewed by me and considered in my medical decision making (see chart for details).     30yF with what was probably a seizure. She was in police custody. Officer with her in ED was also with her during the event. I spoke with him briefly and asked if he thought this may be an " I'm having a seizure because I'm in police custody" type of event. His impression was that it wasn't and that "her head bounced against the pavement." She does have a pretty good sized hematoma to her R parietal scalp. People with pseudoseizures often aren't allowing themselves to be injured like this.   She reports a  history of seizure. Hasn't been on meds for months because of insurance reasons. Currently her neuro exam is nonfocal. Hx of drug abuse but she denies usage in at least several days. Seizure from acute ingestion or withdrawal is a consideration but her mentation is fine currently. Not tremulous. Vitals ok.   Aside from the scalp hematoma, I'm not overly concerned about a serious head injury. I don't think a CT is unreasonable though particularly since she will likely have a continued headache for a few days and will be in police custody. This should provide some reassurance but shouldn't override good judgement if she should subsequently develop concerning symptoms.    Workup fairly unremarkable aside from leukocytosis which is nonspecific. CT ok. No new complaints. Advised  that cant drive until 74mo seizure free unless cleared by neurology.   It has been determined that no acute conditions requiring further emergency intervention are present at this time. The patient has been advised of the diagnosis and plan. I reviewed any labs and imaging including any potential incidental findings. We have discussed signs and symptoms that warrant return to the ED and they are listed in the discharge instructions.    Final Clinical Impressions(s) / ED Diagnoses   Final diagnoses:  Seizure (HCC)  Hematoma of scalp, initial encounter    New Prescriptions New Prescriptions   No medications on file     Raeford Razor, MD 07/24/16 1559

## 2016-07-24 NOTE — ED Triage Notes (Signed)
Pt reports having a seizure while being booked [place] the jail.  Fell back and hit head.  Officer states it lasted about a minute.  Pt has hx of seizures and does not take any medications.

## 2019-01-10 IMAGING — CT CT HEAD W/O CM
3 series · 16 of 47 positions shown, 19 images · non-contrast
Comparison: CT 01/19/2016

CLINICAL DATA: Fall.

EXAM:
CT HEAD WITHOUT CONTRAST
TECHNIQUE: Contiguous axial images were obtained from the base of the skull
through the vertex without intravenous contrast.

[Series 2: head trauma wo · axial · 0.44mm/px · z∈[+204,+334]mm · 10 of 32 slices shown, 13 images]
[im 3/32  brain]
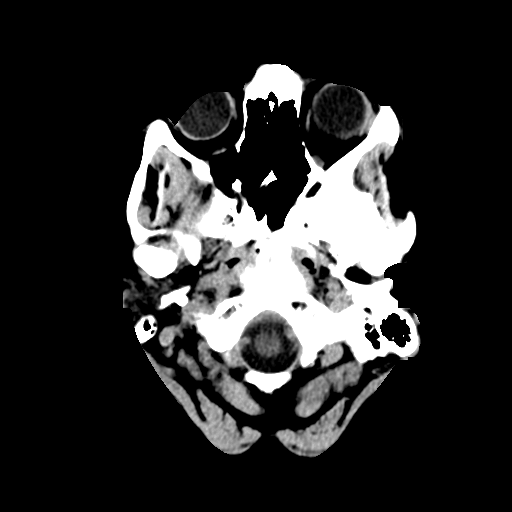
[im 3/32  bone]
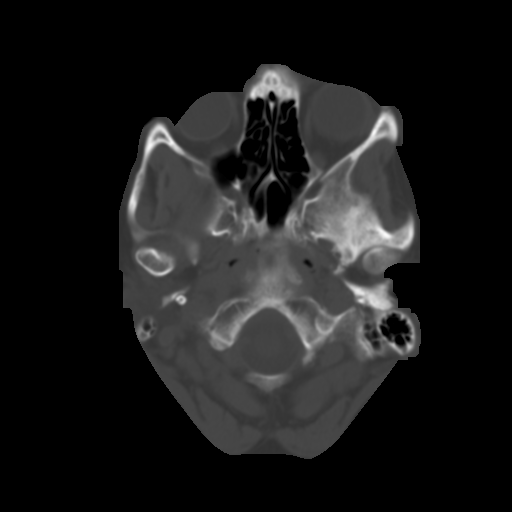
[im 6/32  brain]
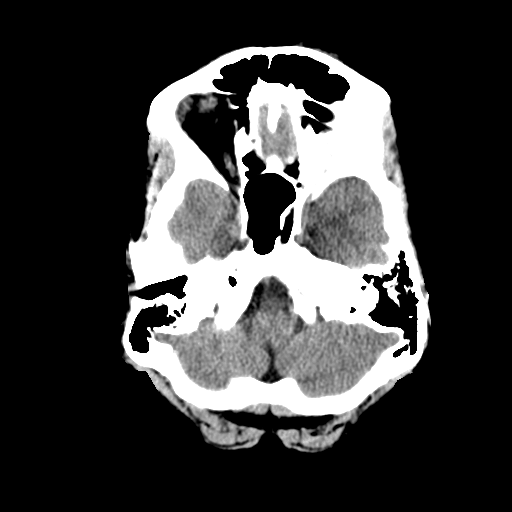
[im 9/32  brain]
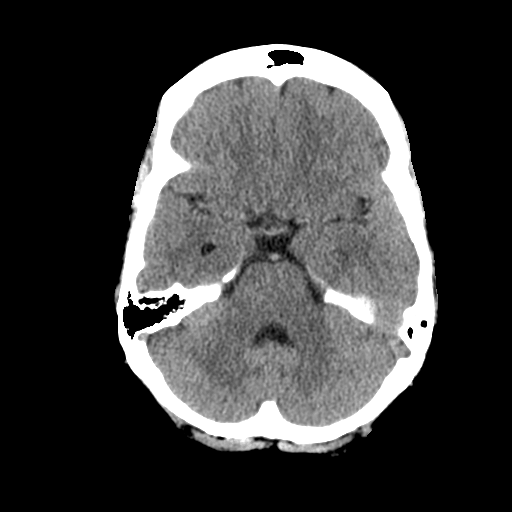
[im 11/32  brain]
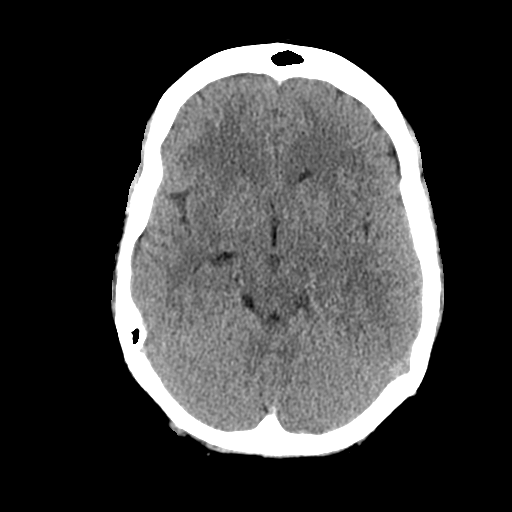
[im 14/32  brain]
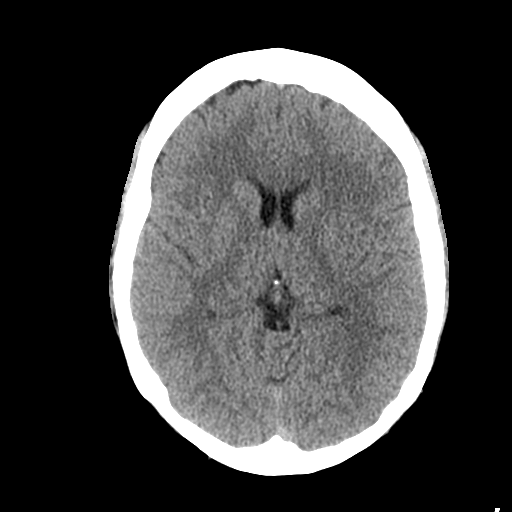
[im 14/32  bone]
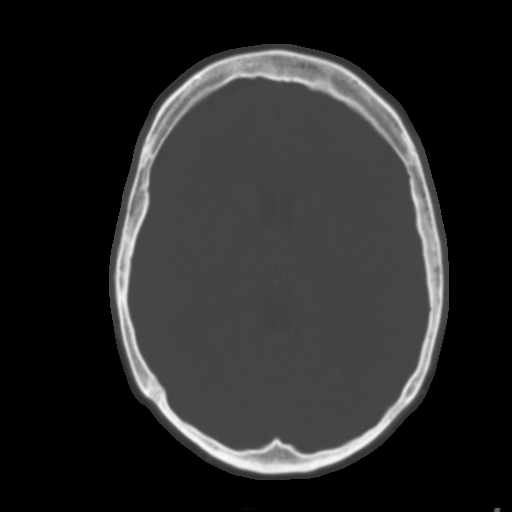
[im 18/32  brain]
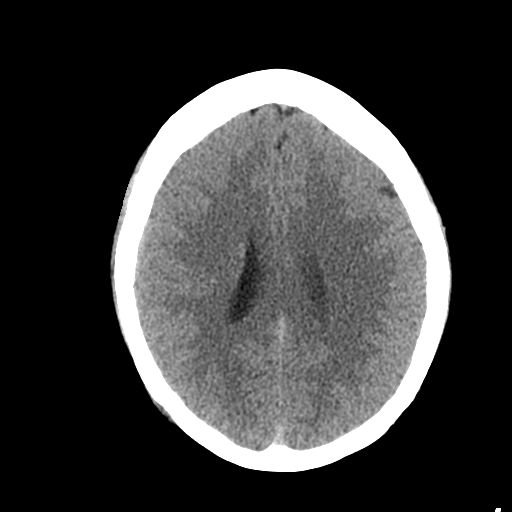
[im 21/32  brain]
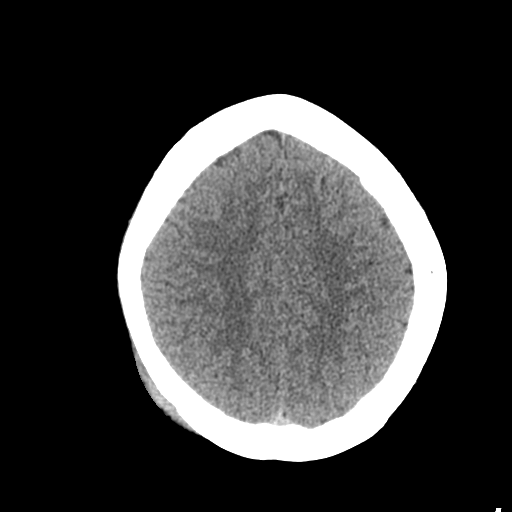
[im 24/32  brain]
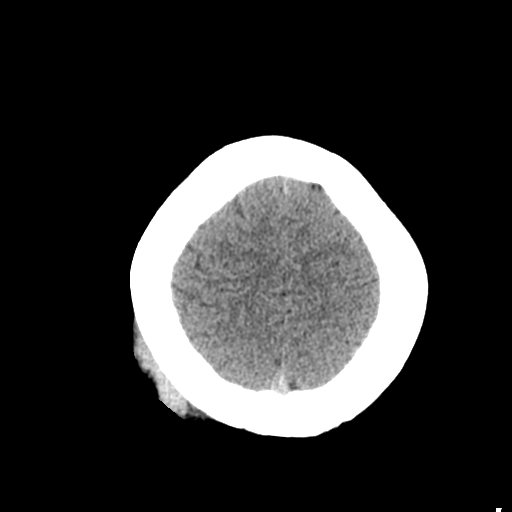
[im 26/32  brain]
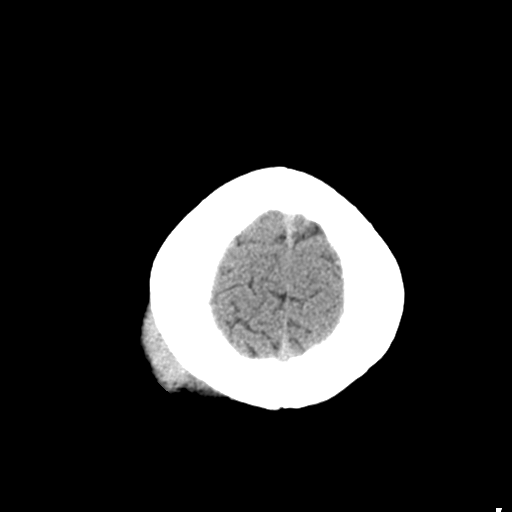
[im 26/32  bone]
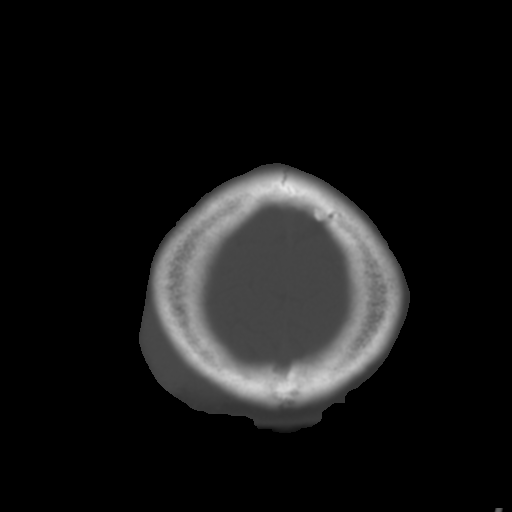
[im 29/32  brain]
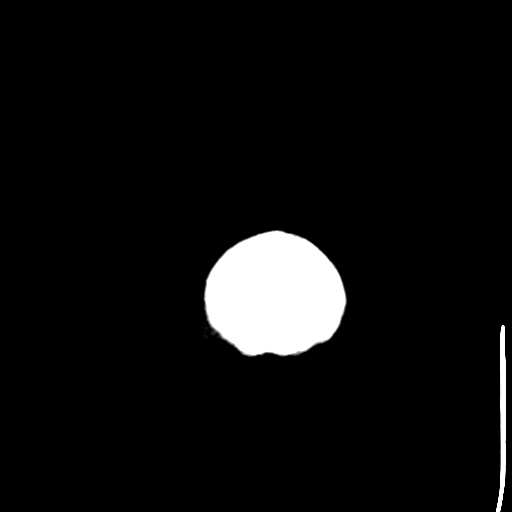

[Series 4: coronal soft tissue · coronal · 0.32mm/px · 3 of 66 slices shown]
[im 22/66  brain]
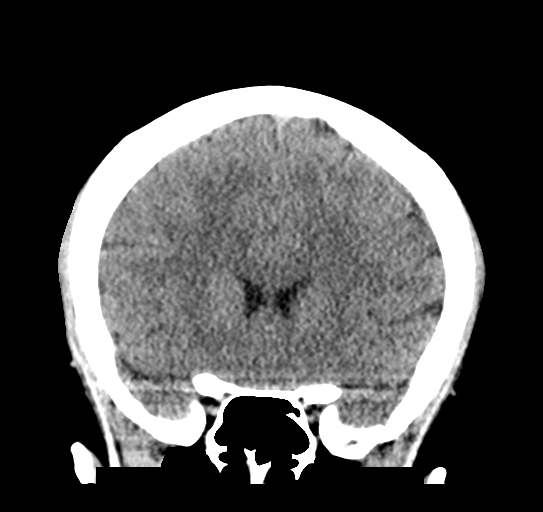
[im 29/66  brain]
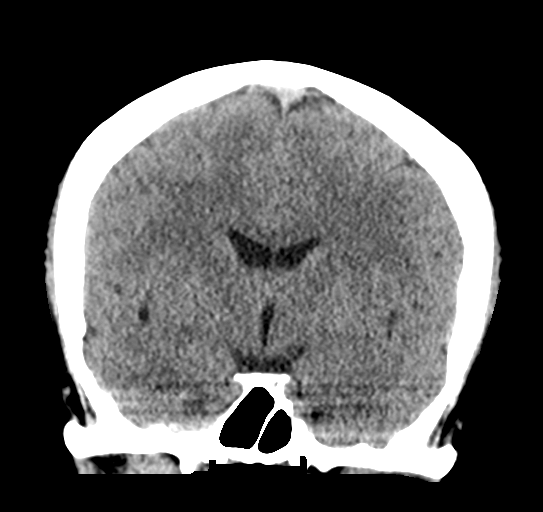
[im 37/66  brain]
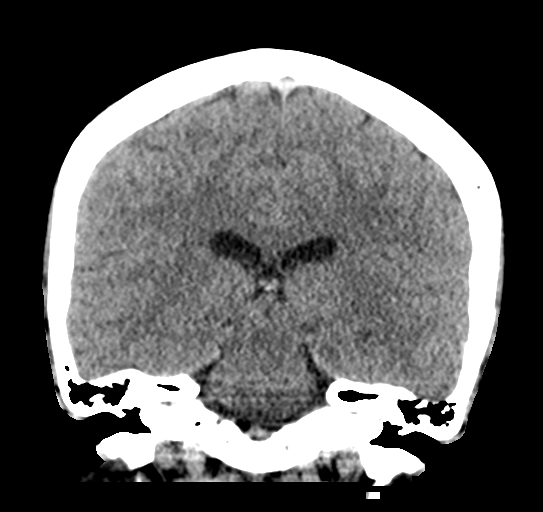

[Series 5: sagittal soft tissue · sagittal · 0.32mm/px · 3 of 57 slices shown]
[im 19/57  brain]
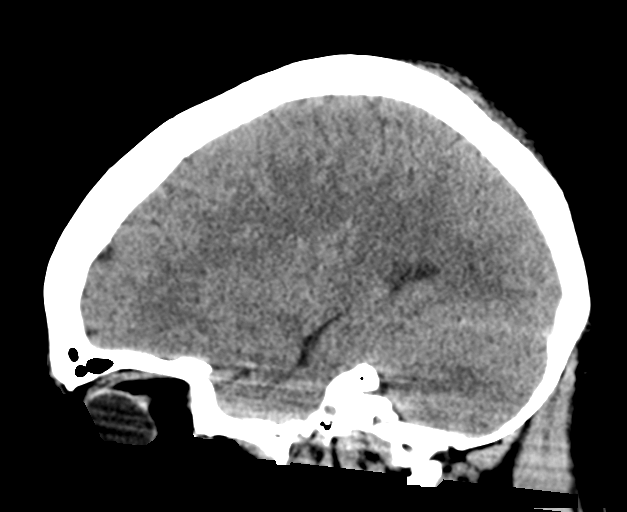
[im 29/57  brain]
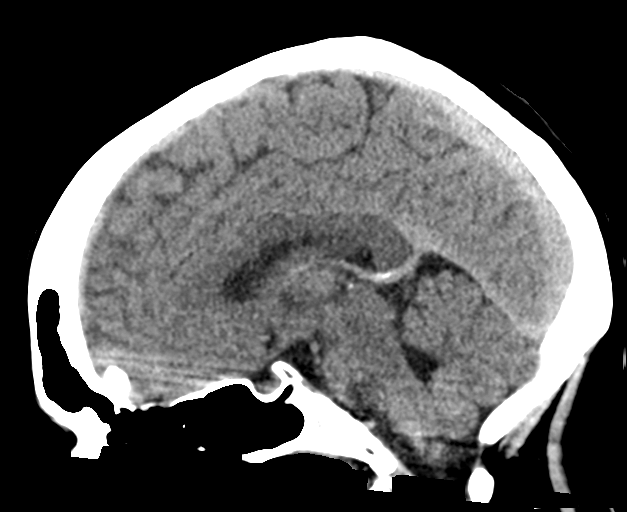
[im 38/57  brain]
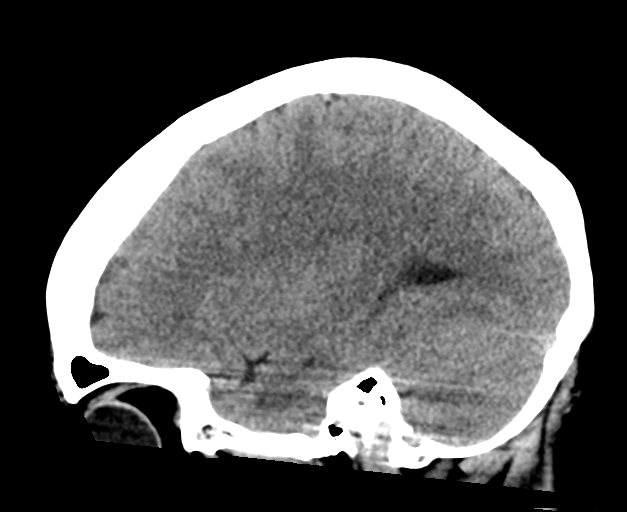

[16 of 47 positions shown; findings below may reference images not displayed]

FINDINGS: Brain: No evidence of acute infarction, hemorrhage, hydrocephalus,
extra-axial collection or mass lesion/mass effect.

Vascular: Negative for hyperdense vessel

Skull: Negative for fracture.  Right parietal scalp hematoma

Sinuses/Orbits: Negative

Other: None
IMPRESSION: No significant intracranial abnormality. Right parietal scalp
hematoma without skull fracture.
